# Patient Record
Sex: Male | Born: 1975 | Race: White | Hispanic: No | Marital: Married | Smoking: Current every day smoker
Health system: Southern US, Community
[De-identification: ages and names within clinical notes are randomized; demographics above are authoritative.]

## PROBLEM LIST (undated history)

## (undated) DIAGNOSIS — R0602 Shortness of breath: Secondary | ICD-10-CM

## (undated) DIAGNOSIS — A048 Other specified bacterial intestinal infections: Secondary | ICD-10-CM

## (undated) DIAGNOSIS — M545 Low back pain, unspecified: Secondary | ICD-10-CM

## (undated) DIAGNOSIS — F419 Anxiety disorder, unspecified: Secondary | ICD-10-CM

## (undated) HISTORY — DX: Low back pain: M54.5

## (undated) HISTORY — PX: FRACTURE SURGERY: SHX138

## (undated) HISTORY — PX: KNEE ARTHROSCOPY: SUR90

## (undated) HISTORY — DX: Low back pain, unspecified: M54.50

---

## 2001-05-13 ENCOUNTER — Emergency Department (HOSPITAL_COMMUNITY): Admission: EM | Admit: 2001-05-13 | Discharge: 2001-05-14 | Payer: Self-pay | Admitting: Emergency Medicine

## 2001-05-13 ENCOUNTER — Encounter: Payer: Self-pay | Admitting: Emergency Medicine

## 2007-07-09 ENCOUNTER — Other Ambulatory Visit: Admission: RE | Admit: 2007-07-09 | Discharge: 2007-07-09 | Payer: Self-pay | Admitting: Family Medicine

## 2007-07-09 ENCOUNTER — Encounter (INDEPENDENT_AMBULATORY_CARE_PROVIDER_SITE_OTHER): Payer: Self-pay | Admitting: Family Medicine

## 2010-11-04 ENCOUNTER — Encounter: Payer: Self-pay | Admitting: Family Medicine

## 2011-11-16 DIAGNOSIS — A048 Other specified bacterial intestinal infections: Secondary | ICD-10-CM

## 2011-11-16 HISTORY — DX: Other specified bacterial intestinal infections: A04.8

## 2011-12-04 ENCOUNTER — Encounter (HOSPITAL_COMMUNITY): Payer: Self-pay | Admitting: *Deleted

## 2011-12-04 ENCOUNTER — Emergency Department (HOSPITAL_COMMUNITY)
Admission: EM | Admit: 2011-12-04 | Discharge: 2011-12-04 | Disposition: A | Discharge status: Home / Self Care | Payer: BC Managed Care – PPO | Attending: Emergency Medicine | Admitting: Emergency Medicine

## 2011-12-04 DIAGNOSIS — B019 Varicella without complication: Secondary | ICD-10-CM

## 2011-12-04 DIAGNOSIS — R109 Unspecified abdominal pain: Secondary | ICD-10-CM | POA: Insufficient documentation

## 2011-12-04 DIAGNOSIS — F17200 Nicotine dependence, unspecified, uncomplicated: Secondary | ICD-10-CM | POA: Insufficient documentation

## 2011-12-04 DIAGNOSIS — IMO0001 Reserved for inherently not codable concepts without codable children: Secondary | ICD-10-CM | POA: Insufficient documentation

## 2011-12-04 HISTORY — DX: Other specified bacterial intestinal infections: A04.8

## 2011-12-04 LAB — DIFFERENTIAL
Basophils Absolute: 0.1 10*3/uL (ref 0.0–0.1)
Basophils Relative: 1 % (ref 0–1)
Eosinophils Absolute: 0.3 10*3/uL (ref 0.0–0.7)
Eosinophils Relative: 3 % (ref 0–5)
Lymphocytes Relative: 35 % (ref 12–46)

## 2011-12-04 LAB — CBC
MCV: 88.2 fL (ref 78.0–100.0)
Platelets: 284 10*3/uL (ref 150–400)
RDW: 13.4 % (ref 11.5–15.5)
WBC: 10.8 10*3/uL — ABNORMAL HIGH (ref 4.0–10.5)

## 2011-12-04 LAB — COMPREHENSIVE METABOLIC PANEL
Albumin: 4 g/dL (ref 3.5–5.2)
Alkaline Phosphatase: 82 U/L (ref 39–117)
BUN: 11 mg/dL (ref 6–23)
Chloride: 102 mEq/L (ref 96–112)
Glucose, Bld: 83 mg/dL (ref 70–99)
Potassium: 4.8 mEq/L (ref 3.5–5.1)
Total Bilirubin: 0.2 mg/dL — ABNORMAL LOW (ref 0.3–1.2)

## 2011-12-04 MED ORDER — VALACYCLOVIR HCL 1 G PO TABS: 500.0000 mg | ORAL_TABLET | Freq: Three times a day (TID) | ORAL | Status: AC

## 2011-12-04 NOTE — ED Notes (Signed)
Recent dx of H pylori,  Now has a rash and cont to have abd pain and  Body aches.

## 2011-12-04 NOTE — ED Provider Notes (Signed)
History   This chart was scribed for Nelia Shi, MD by Sofie Rower. The patient was seen in room APA06/APA06 and the patient's care was started at 2:16PM.    CSN: [id number]  Arrival date & time 12/04/11  1258   First MD Initiated Contact with Patient 12/04/11 1414      Chief Complaint  Patient presents with  . Abdominal Pain    (Consider location/radiation/quality/duration/timing/severity/associated sxs/prior treatment) HPI  Keith Willis is a 36 y.o. male who presents to the Emergency Department complaining of moderate, constant abdominal pain onset 7 days ago with associated symptoms of myalgias, vomiting, rash. Pt has a hx of H pylori. Pt works as a Software engineer. Pt states he has recently "seen some lab exposure."   PCP is Dr. Regino Schultze.  Past Medical History  Diagnosis Date  . H. pylori infection     Past Surgical History  Procedure Date  . Knee arthroscopy   . Fracture surgery     History reviewed. No pertinent family history.  History  Substance Use Topics  . Smoking status: Current Everyday Smoker  . Smokeless tobacco: Not on file  . Alcohol Use: No      Review of Systems  All other systems reviewed and are negative.    Allergies  Review of patient's allergies indicates no known allergies.  Home Medications   Current Outpatient Rx  Name Route Sig Dispense Refill  . AMOXICILLIN 500 MG PO CAPS Oral Take 1,000 mg by mouth 2 (two) times daily.    Marland Kitchen CLARITHROMYCIN ER 500 MG PO TB24 Oral Take 500 mg by mouth daily.    . CYCLOBENZAPRINE HCL 10 MG PO TABS Oral Take 10 mg by mouth 3 (three) times daily as needed. For muscle cramps    . OMEPRAZOLE 20 MG PO CPDR Oral Take 20 mg by mouth daily.    . OXYCODONE-ACETAMINOPHEN 10-325 MG PO TABS Oral Take 1 tablet by mouth every 6 (six) hours as needed. For pain    . VALACYCLOVIR HCL 1 G PO TABS Oral Take 0.5 tablets (500 mg total) by mouth 3 (three) times daily. 15 tablet 0     BP 136/83  Pulse 66  Temp(Src) 97.9 F (36.6 C) (Oral)  Resp 20  Ht 5\' 7"  (1.702 m)  Wt 160 lb (72.576 kg)  BMI 25.06 kg/m2  SpO2 98%  Physical Exam  Nursing note and vitals reviewed. Constitutional: He is oriented to person, place, and time. He appears well-developed and well-nourished. No distress.  HENT:  Head: Normocephalic and atraumatic.  Nose: Nose normal.  Eyes: EOM are normal. Pupils are equal, round, and reactive to light.  Neck: Normal range of motion. Neck supple.  Cardiovascular: Normal rate and intact distal pulses.   Pulmonary/Chest: No respiratory distress.  Abdominal: Normal appearance. He exhibits no distension.  Musculoskeletal: Normal range of motion.  Neurological: He is alert and oriented to person, place, and time. No cranial nerve deficit.  Skin: Skin is warm and dry. Rash noted.  Psychiatric: He has a normal mood and affect. His behavior is normal.    ED Course  Procedures (including critical care time)  DIAGNOSTIC STUDIES: Oxygen Saturation is 98% on room air, normal by my interpretation.    COORDINATION OF CARE:      Labs Reviewed  COMPREHENSIVE METABOLIC PANEL - Abnormal; Notable for the following:    Total Bilirubin 0.2 (*)    All other components within normal limits  CBC - Abnormal; Notable for the following:    WBC 10.8 (*)    All other components within normal limits  DIFFERENTIAL   No results found.   1. Chicken pox     2:20PM- EDP at bedside discusses treatment plan.  MDM    1. Chicken pox         I personally performed the services described in this documentation, which was scribed in my presence. The recorded information has been reviewed and considered.     Nelia Shi, MD 12/04/11 1535

## 2011-12-04 NOTE — ED Notes (Signed)
Dr Beaton at bedside 

## 2011-12-04 NOTE — Discharge Instructions (Signed)
Chickenpox (Varicella) Chickenpox (Varicella) is a viral infection that is more common in children. It tends to be a mild illness for most healthy children. It can be more severe in:  Adults.   Newborns.   People with immune system problems.   People receiving cancer treatment.  CAUSES  Chickenpox is caused by a virus called Varicella-Zoster Virus (VZV). VZV causes both chickenpox and shingles. To get chickenpox, a susceptible person (able to catch an infection) must be exposed to either someone with chickenpox or shingles. A person is susceptible if:  They have not had the infection before.   They were not immunized against VZV.   An immunization did not give complete protection against VZV (breakthrough chickenpox).  Chickenpox is very contagious. It is contagious from 1 to 2 days before the rash appears. It is also contagious until the blisters are crusted. The blisters usually become crusted 3 to 7 days after the rash begins. It usually takes about 2 weeks before symptoms show up. SYMPTOMS  Typical chickenpox symptoms include:  Fever.   Headache.   Poor appetite.   An itchy rash that changes over time:   It starts as red spots that become bumps.   Bumps become blisters.   Blisters turn into scabs.  Breakthrough chickenpox happens when an immunized person still gets chickenpox. The symptoms are less severe. The rash may only be red spots or bumps, with no blisters or scabs. Fever may be low or absent. DIAGNOSIS  Typical chickenpox is diagnosed by physical exam. A blood test can confirm the diagnosis, when the disease is not certain. TREATMENT  Most of the time, in healthy children, only home treatments are needed. In some cases, in the early stages of chickenpox, your caregiver may prescribe antiviral medicines. These medicines may decrease the severity of the illness and prevent complications. In the rare complicated case, treatment in the hospital is needed. Intravenous  (IV) medicine and other treatments can be given in the hospital. Your caregiver may prescribe medicine to relieve itching. To prevent the spread of chickenpox to at risk people, your caregiver may prescribe:  Immunization.   Antiviral medicine.   Immune globulin.  HOME CARE INSTRUCTIONS   For fever:   Do not give aspirin to children. This could lead to brain and liver damage through Reye's syndrome. Read the label on over-the-counter medicines used.   Only take over-the-counter or prescription medicines for pain, discomfort or fever as directed by your caregiver.   For itching:   If your caregiver prescribed medicine, take as directed.   You may use plain calamine lotion on the itching sores. Follow the directions on the label. Do not use on sores in the mouth.   Avoid scratching the rash or picking off the scabs. Keep fingernails cut short and clean. Put cotton gloves or socks on your child's hands at night.   Keep a child with chickenpox quiet and cool. Sweating and overheating makes itching worse. Stay out of the sun.   Cool compresses may be applied to itchy areas.   Cool water baths with baking soda or oatmeal soap may help.   For painful sores in the mouth; pain medicine and cold foods, like frozen pops, may feel good.   Drink plenty of fluids. Avoid salty or acidic liquids (tomato or orange juice). These irritate mouth sores and cause pain.   People with chickenpox should avoid exposure (being in the same room) with:   Pregnant women (especially if they have not  had chickenpox or been immunized against it).   Young infants.   People receiving cancer treatments or long-term steroids.   People with immune system problems.   The elderly.   Any child or adult with chickenpox should stay home until all blisters have crusted. If there are no blisters, the child or adult should stay home until no new spots show up.  SEEK MEDICAL CARE IF:   You or your child has an  oral temperature above 102 F (38.9 C).   Your baby is older than 3 months with a rectal temperature of 100.5 F (38.1 C) or higher for more than 1 day.   The sores are infected. Look for:   Swelling.   Increasing redness.   Red streaks.   Tenderness.   Yellow or green pus coming from blisters.   Cough.   New symptoms develop that concern you.  SEEK IMMEDIATE MEDICAL CARE IF:  You or your child develops:  Vomiting.   Confusion, unusual sleepiness or odd behavior.   Neck stiffness.   Seizures (convulsions).   Loss of balance.   Chest pain.   Trouble breathing or fast breathing.   Blood in urine.   Rectal bleeding.   Bruising of the skin or bleeding in the blisters.   Blisters in the eye.   Eye pain, redness or decreased vision.   You or your child has an oral temperature above 102 F (38.9 C), not controlled by medicine.   Your baby is older than 3 months with a rectal temperature of 102 F (38.9 C) or higher.   Your baby is 60 months old or younger with a rectal temperature of 100.4 F (38 C) or higher.  MAKE SURE YOU:   Understand these instructions.   Will watch your condition.   Will get help right away if you are not doing well or get worse.  Document Released: 09/28/2000 Document Revised: 06/13/2011 Document Reviewed: 04/15/2008 [facility] Patient Information 2012 [place], [place].

## 2011-12-11 ENCOUNTER — Encounter: Payer: Self-pay | Admitting: Urgent Care

## 2011-12-11 ENCOUNTER — Ambulatory Visit (INDEPENDENT_AMBULATORY_CARE_PROVIDER_SITE_OTHER): Payer: BC Managed Care – PPO | Admitting: Urgent Care

## 2011-12-11 DIAGNOSIS — K921 Melena: Secondary | ICD-10-CM

## 2011-12-11 DIAGNOSIS — A048 Other specified bacterial intestinal infections: Secondary | ICD-10-CM

## 2011-12-11 DIAGNOSIS — R109 Unspecified abdominal pain: Secondary | ICD-10-CM | POA: Insufficient documentation

## 2011-12-11 NOTE — Assessment & Plan Note (Signed)
Chronic, intermittent. Differentials include benign anorectal source such as hemorrhoids, inflammatory bowel disease, less likely ischemia or colorectal neoplasia.  Stop using Advil/ Motrin or anti-inflammatories for now Colonoscopy and EGD with Dr. Jena Gauss in the near future.  I have discussed risks & benefits which include, but are not limited to, bleeding, infection, perforation & drug reaction.  The patient agrees with this plan & written consent will be obtained.

## 2011-12-11 NOTE — Progress Notes (Signed)
Faxed to PCP

## 2011-12-11 NOTE — Progress Notes (Signed)
Referring Provider: McGough, William M, MD Primary Care Physician:  MCGOUGH,WILLIAM M, MD, MD Primary Gastroenterologist:  Dr. Rourk  Chief Complaint  Patient presents with  . Abdominal Pain    h pylori    HPI:  Keith Willis is a 35 y.o. male here as a referral from Dr. McGough for chronic abdominal pain and H. Pylori.  He gives lifelong history of intermittent chronic abdominal pain. He describes memories of weight lifting in high school that cause some abdominal pain followed by bleeding hemorrhoids. He tells me has always had a "sensitive stomach".  Somewhere around Age 19 he contracted a virus.   For several weeks after that he could not keep anything down due to persistent vomiting.  He has intermittent abdominal pain, followed by diaphoresis.  He describes the pain as "gas-like".   He was diagnosed with h pylori and has almost completed treatment with Biaxin, Amoxil, and Prilosec.  He has noticed no difference since H. pylori treatment began. He gives history of taking TUMS religiously.  He rarely complains of acid reflux.  Abdominal pain usually begins around the mid abd and radiates to his chest at times.  He has nausea , but has not vomited since 2 wks ago.  Pain 10/10 at worst, usually lasts 30 secs to 30 min.  otherwise it is a continuous dull aching pain after the severe pain subsides.  There is no particular time of day for his pain.  He has been eating large meals due to work schedule and frequent periods of fasting.  He admits to eating a lot of greasy food.  However, his pain is not assoc w/ eating or movement.  Relieved fetal position makes it better.  He gives history of greater than 25 pound weight loss from 2008-2009, but has since been stable.  C/o anorexia.  Feels food lodges in throat, and he must flush w/ bolus of liquid.  Denies odynophagia.  BM irregular BID to 1-2 per week.  He has been noticing small to moderate amount of bright red blood w/ wiping on the toilet paper and he  thinks he has hemorrhoids.  Takes an occasional IBU prn & percocet prn for chronic low back pain.  Recent labs reviewed from December 2012 and February 11th 2013: He is normal CMP and CBC currently H. pylori antigen 2.92  Past Medical History  Diagnosis Date  . H. pylori infection 11/2011    treatment  . Low back pain     Past Surgical History  Procedure Date  . Knee arthroscopy   . Fracture surgery     face    Current Outpatient Prescriptions  Medication Sig Dispense Refill  . amoxicillin (AMOXIL) 500 MG capsule Take 1,000 mg by mouth 2 (two) times daily.      . clarithromycin (BIAXIN XL) 500 MG 24 hr tablet Take 500 mg by mouth daily.      . cyclobenzaprine (FLEXERIL) 10 MG tablet Take 10 mg by mouth 3 (three) times daily as needed. For muscle cramps      . ibuprofen (ADVIL,MOTRIN) 200 MG tablet Take 200 mg by mouth every 6 (six) hours as needed.      . omeprazole (PRILOSEC) 20 MG capsule Take 20 mg by mouth daily.      . oxyCODONE-acetaminophen (PERCOCET) 10-325 MG per tablet Take 1 tablet by mouth every 6 (six) hours as needed. For pain      . valACYclovir (VALTREX) 1000 MG tablet Take 0.5 tablets (500 mg   total) by mouth 3 (three) times daily.  15 tablet  0    Allergies as of 12/11/2011  . (No Known Allergies)    Family History:There is no known family history of colorectal carcinoma , liver disease, or inflammatory bowel disease.  Problem Relation Age of Onset  . Ulcers Father     History   Social History  . Marital Status: Married    Spouse Name: N/A    Number of Children: 2  . Years of Education: N/A   Occupational History  . 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 Smoking status: Current Everyday Smoker -- 0.5 packs/day for 8 years    Types: Cigarettes  . Smokeless tobacco: Not on file   Comment: stress  . Alcohol Use: No  . Drug Use: No  . Sexually Active: Not on file   Other Topics Concern  . Not on file   Social History Narrative   2  healthy daughters  Review of Systems: Gen: Denies any fever, chills, sweats, anorexia, fatigue, weakness, malaise, and sleep disorder CV: Denies angina, palpitations, syncope, orthopnea, PND, peripheral edema, and claudication. Resp: Denies dyspnea at rest, dyspnea with exercise, cough, sputum, wheezing, coughing up blood, and pleurisy. GI: Denies vomiting blood, jaundice, and fecal incontinence.  GU : Denies urinary burning, blood in urine, urinary frequency, urinary hesitancy, nocturnal urination, and urinary incontinence. MS: Complains of chronic low back pain Denies joint pain, limitation of movement, and swelling, stiffness,extremity pain. Denies muscle weakness, cramps, atrophy.  Derm:  Recent herpes zoster Psych: Denies depression, anxiety, memory loss, suicidal ideation, hallucinations, paranoia, and confusion. Heme: Denies bruising, bleeding, and enlarged lymph nodes. Neuro:  Denies any headaches, dizziness.  Intermittent Numbness and tingling down left leg Endo:  Denies any problems with DM, thyroid, adrenal function.  Physical Exam: BP 127/85  Pulse 91  Temp(Src) 98.2 F (36.8 C) (Temporal)  Ht 5' 7.5" (1.715 m)  Wt 166 lb 3.2 oz (75.388 kg)  BMI 25.65 kg/m2 General:   Alert,  Well-developed, well-nourished, pleasant and cooperative in NAD Head:  Normocephalic and atraumatic. Eyes:  Sclera clear, no icterus.   Conjunctiva pink. Ears:  Normal auditory acuity. Nose:  No deformity, discharge, or lesions. Mouth:  No deformity or lesions,oropharynx pink & moist. Neck:  Supple; no masses or thyromegaly. Lungs:  Clear throughout to auscultation.   No wheezes, crackles, or rhonchi. No acute distress. Heart:  Regular rate and rhythm; no murmurs, clicks, rubs,  or gallops. Abdomen:  Normal bowel sounds.  No bruits.  Soft, non-tender and non-distended without masses, hepatosplenomegaly or hernias noted.  No guarding or rebound tenderness.   Rectal:  Deferred until colonoscopy. Msk:   Symmetrical without gross deformities. Normal posture. Pulses:  Normal pulses noted. Extremities:  No clubbing or edema. Neurologic:  Alert and oriented x4;  grossly normal neurologically. Skin: Multiple punctate erythematous scabs to anterior chest. No exudates. No vesicles. Lymph Nodes:  No significant cervical adenopathy. Psych:  Alert and cooperative. Normal mood and affect.  

## 2011-12-11 NOTE — Assessment & Plan Note (Signed)
Complete treatment

## 2011-12-11 NOTE — Patient Instructions (Signed)
Increase Prilosec to 20 mg before breakfast and dinner Complete Biaxin and Amoxil as directed Stop using Advil Motrin or anti-inflammatories for now You will need a colonoscopy and EGD with Dr. Jena Gauss in the near future 1-800-quit-now for help with quitting smoking To ER if severe abdominal pain

## 2011-12-11 NOTE — Assessment & Plan Note (Addendum)
Keith Willis is a pleasant 36 y.o. male with chronic abdominal pain. He also has intermittent bouts of nausea and vomiting. He has recently been treated for H. Pylori.  Etiology of his abdominal pain is unclear at this time.  He is going to need EGD and colonoscopy for further evaluation. Differentials include peptic ulcer disease, H. pylori gastritis, inflammatory bowel disease, or functional abdominal pain.  1-800-quit-now for help with quitting smoking To ER if severe abdominal pain Continue Prilosec 20 mg twice a day

## 2011-12-12 ENCOUNTER — Other Ambulatory Visit: Payer: Self-pay | Admitting: Gastroenterology

## 2011-12-12 DIAGNOSIS — K921 Melena: Secondary | ICD-10-CM

## 2011-12-12 DIAGNOSIS — R109 Unspecified abdominal pain: Secondary | ICD-10-CM

## 2011-12-12 MED ORDER — PEG-KCL-NACL-NASULF-NA ASC-C 100 G PO SOLR: 1.0000 | Freq: Once | ORAL | Status: DC

## 2011-12-17 ENCOUNTER — Encounter (HOSPITAL_COMMUNITY): Payer: Self-pay | Admitting: Pharmacy Technician

## 2011-12-19 ENCOUNTER — Encounter: Payer: Self-pay | Admitting: Gastroenterology

## 2011-12-19 MED ORDER — SODIUM CHLORIDE 0.45 % IV SOLN
Freq: Once | INTRAVENOUS | Status: AC
  Administered 2011-12-20: 10:00:00 via INTRAVENOUS

## 2011-12-20 ENCOUNTER — Encounter (HOSPITAL_COMMUNITY)
Admission: RE | Disposition: A | Discharge status: Home / Self Care | Payer: Self-pay | Source: Ambulatory Visit | Attending: Internal Medicine

## 2011-12-20 ENCOUNTER — Encounter (HOSPITAL_COMMUNITY): Payer: Self-pay | Admitting: *Deleted

## 2011-12-20 ENCOUNTER — Ambulatory Visit (HOSPITAL_COMMUNITY)
Admission: RE | Admit: 2011-12-20 | Discharge: 2011-12-20 | Disposition: A | Discharge status: Home / Self Care | Payer: BC Managed Care – PPO | Source: Ambulatory Visit | Attending: Internal Medicine | Admitting: Internal Medicine

## 2011-12-20 DIAGNOSIS — D126 Benign neoplasm of colon, unspecified: Secondary | ICD-10-CM

## 2011-12-20 DIAGNOSIS — R1013 Epigastric pain: Secondary | ICD-10-CM

## 2011-12-20 DIAGNOSIS — K2940 Chronic atrophic gastritis without bleeding: Secondary | ICD-10-CM | POA: Insufficient documentation

## 2011-12-20 DIAGNOSIS — K648 Other hemorrhoids: Secondary | ICD-10-CM | POA: Insufficient documentation

## 2011-12-20 DIAGNOSIS — K921 Melena: Secondary | ICD-10-CM | POA: Insufficient documentation

## 2011-12-20 DIAGNOSIS — K2289 Other specified disease of esophagus: Secondary | ICD-10-CM

## 2011-12-20 DIAGNOSIS — K228 Other specified diseases of esophagus: Secondary | ICD-10-CM

## 2011-12-20 DIAGNOSIS — R109 Unspecified abdominal pain: Secondary | ICD-10-CM | POA: Insufficient documentation

## 2011-12-20 DIAGNOSIS — K625 Hemorrhage of anus and rectum: Secondary | ICD-10-CM

## 2011-12-20 HISTORY — DX: Shortness of breath: R06.02

## 2011-12-20 HISTORY — DX: Anxiety disorder, unspecified: F41.9

## 2011-12-20 SURGERY — COLONOSCOPY WITH ESOPHAGOGASTRODUODENOSCOPY (EGD)
Anesthesia: Moderate Sedation

## 2011-12-20 MED ORDER — MEPERIDINE HCL 100 MG/ML IJ SOLN
INTRAMUSCULAR | Status: AC
  Filled 2011-12-20: qty 1

## 2011-12-20 MED ORDER — BUTAMBEN-TETRACAINE-BENZOCAINE 2-2-14 % EX AERO
INHALATION_SPRAY | CUTANEOUS | Status: DC | PRN
  Administered 2011-12-20: 1 via TOPICAL

## 2011-12-20 MED ORDER — STERILE WATER FOR IRRIGATION IR SOLN
Status: DC | PRN
  Administered 2011-12-20: 10:00:00

## 2011-12-20 MED ORDER — MIDAZOLAM HCL 5 MG/5ML IJ SOLN
INTRAMUSCULAR | Status: DC | PRN
  Administered 2011-12-20: 1 mg via INTRAVENOUS
  Administered 2011-12-20 (×2): 2 mg via INTRAVENOUS
  Administered 2011-12-20: 1 mg via INTRAVENOUS
  Administered 2011-12-20: 2 mg via INTRAVENOUS
  Administered 2011-12-20: 1 mg via INTRAVENOUS

## 2011-12-20 MED ORDER — MEPERIDINE HCL 100 MG/ML IJ SOLN
INTRAMUSCULAR | Status: DC | PRN
  Administered 2011-12-20 (×2): 50 mg via INTRAVENOUS
  Administered 2011-12-20: 25 mg via INTRAVENOUS
  Administered 2011-12-20: 50 mg via INTRAVENOUS
  Administered 2011-12-20 (×2): 25 mg via INTRAVENOUS

## 2011-12-20 MED ORDER — MIDAZOLAM HCL 5 MG/5ML IJ SOLN
INTRAMUSCULAR | Status: AC
  Filled 2011-12-20: qty 10

## 2011-12-20 MED ORDER — MEPERIDINE HCL 100 MG/ML IJ SOLN
INTRAMUSCULAR | Status: AC
  Filled 2011-12-20: qty 2

## 2011-12-20 NOTE — Op Note (Signed)
[facility] [address] [place], [place]  [address]  COLONOSCOPY PROCEDURE REPORT  PATIENT:  Keith Willis, Keith Willis  MR#:  [id number] BIRTHDATE:  12-22-1975, 35 yrs. old  GENDER:  male ENDOSCOPIST:  R. Roetta Sessions, MD FACP [facility] REF. BY:  Karleen Hampshire, M.D. PROCEDURE DATE:  12/20/2011 PROCEDURE:  Ileo- colonoscopy with biopsy  INDICATIONS:  Rectal bleeding  INFORMED CONSENT:  The risks, benefits, alternatives and imponderables including but not limited to bleeding, perforation as well as the possibility of a missed lesion have been reviewed. The potential for biopsy, lesion removal, etc. have also been discussed.  Questions have been answered.  All parties agreeable. Please see the history and physical in the medical record for more information.  MEDICATIONS:  Demerol 225 mg IV and Versed 9 mg IV in divided dose  DESCRIPTION OF PROCEDURE:  After a digital rectal exam was performed, the EC-[id number] ([id number]) colonoscope was advanced from the anus through the rectum and colon to the area of the cecum, ileocecal valve and appendiceal orifice.  The cecum was deeply intubated.  These structures were well-seen and photographed for the record.  From the level of the cecum and ileocecal valve, the scope was slowly and cautiously withdrawn.  The mucosal surfaces were carefully surveyed utilizing scope tip deflection to facilitate fold flattening as needed.  The scope was pulled down into the rectum where a thorough examination including retroflexion was performed. <<PROCEDUREIMAGES>>  FINDINGS: Adequate preparation. Minimal anal canal hemorrhoids; otherwise normal rectum. A focal 5 mm area of abrasion overlying polypoid mucosa in the mid descending segment. There was a 4 mm diminutive polyp in the mid descending colon; otherwise, the colonic mucosa appeared normal. The distal 5 cm of terminal ileal mucosa also appeared normal.  THERAPEUTIC / DIAGNOSTIC MANEUVERS PERFORMED:   The 2 abnormal areas above were cold biopsied  COMPLICATIONS:  None  CECAL WITHDRAWAL TIME: 10 minutes  IMPRESSION:  Colonic polyps-treated as described above. Minimal anal canal hemorrhoids.  RECOMMENDATIONS:  Follow up on pathology  ______________________________ R. Roetta Sessions, MD Caleen Essex  CC:  Karleen Hampshire, M.D.  n. eSIGNED:   R. Roetta Sessions at 12/20/2011 11:21 AM  [place], [phone]

## 2011-12-20 NOTE — Interval H&P Note (Signed)
History and Physical Interval Note:  12/20/2011 10:08 AM  Keith Willis  has presented today for surgery, with the diagnosis of abd pain and hematochezia  The various methods of treatment have been discussed with the patient and family. After consideration of risks, benefits and other options for treatment, the patient has consented to  Procedure(s) (LRB): COLONOSCOPY WITH ESOPHAGOGASTRODUODENOSCOPY (EGD) (N/A) as a surgical intervention .  The patients' history has been reviewed, patient examined, no change in status, stable for surgery.  I have reviewed the patients' chart and labs.  Questions were answered to the patient's satisfaction.     Eula Listen

## 2011-12-20 NOTE — Discharge Instructions (Signed)
Colonoscopy Discharge Instructions  Read the instructions outlined below and refer to this sheet in the next few weeks. These discharge instructions provide you with general information on caring for yourself after you leave the hospital. Your doctor may also give you specific instructions. While your treatment has been planned according to the most current medical practices available, unavoidable complications occasionally occur. If you have any problems or questions after discharge, call Dr. Gala Romney at [phone]. ACTIVITY  You may resume your regular activity, but move at a slower pace for the next 24 hours.   Take frequent rest periods for the next 24 hours.   Walking will help get rid of the air and reduce the bloated feeling in your belly (abdomen).   No driving for 24 hours (because of the medicine (anesthesia) used during the test).    Do not sign any important legal documents or operate any machinery for 24 hours (because of the anesthesia used during the test).  NUTRITION  Drink plenty of fluids.   You may resume your normal diet as instructed by your doctor.   Begin with a light meal and progress to your normal diet. Heavy or fried foods are harder to digest and may make you feel sick to your stomach (nauseated).   Avoid alcoholic beverages for 24 hours or as instructed.  MEDICATIONS  You may resume your normal medications unless your doctor tells you otherwise.  WHAT YOU CAN EXPECT TODAY  Some feelings of bloating in the abdomen.   Passage of more gas than usual.   Spotting of blood in your stool or on the toilet paper.  IF YOU HAD POLYPS REMOVED DURING THE COLONOSCOPY:  No aspirin products for 7 days or as instructed.   No alcohol for 7 days or as instructed.   Eat a soft diet for the next 24 hours.  FINDING OUT THE RESULTS OF YOUR TEST Not all test results are available during your visit. If your test results are not back during the visit, make an appointment  with your caregiver to find out the results. Do not assume everything is normal if you have not heard from your caregiver or the medical facility. It is important for you to follow up on all of your test results.  SEEK IMMEDIATE MEDICAL ATTENTION IF:  You have more than a spotting of blood in your stool.   Your belly is swollen (abdominal distention).   You are nauseated or vomiting.   You have a temperature over 101.   You have abdominal pain or discomfort that is severe or gets worse throughout the day.   EGD Discharge instructions Please read the instructions outlined below and refer to this sheet in the next few weeks. These discharge instructions provide you with general information on caring for yourself after you leave the hospital. Your doctor may also give you specific instructions. While your treatment has been planned according to the most current medical practices available, unavoidable complications occasionally occur. If you have any problems or questions after discharge, please call your doctor. ACTIVITY  You may resume your regular activity but move at a slower pace for the next 24 hours.   Take frequent rest periods for the next 24 hours.   Walking will help expel (get rid of) the air and reduce the bloated feeling in your abdomen.   No driving for 24 hours (because of the anesthesia (medicine) used during the test).   You may shower.   Do not sign any  important legal documents or operate any machinery for 24 hours (because of the anesthesia used during the test).  NUTRITION  Drink plenty of fluids.   You may resume your normal diet.   Begin with a light meal and progress to your normal diet.   Avoid alcoholic beverages for 24 hours or as instructed by your caregiver.  MEDICATIONS  You may resume your normal medications unless your caregiver tells you otherwise.  WHAT YOU CAN EXPECT TODAY  You may experience abdominal discomfort such as a feeling of  fullness or "gas" pains.  FOLLOW-UP  Your doctor will discuss the results of your test with you.  SEEK IMMEDIATE MEDICAL ATTENTION IF ANY OF THE FOLLOWING OCCUR:  Excessive nausea (feeling sick to your stomach) and/or vomiting.   Severe abdominal pain and distention (swelling).   Trouble swallowing.   Temperature over 101 F (37.8 C).   Rectal bleeding or vomiting of blood.    Start Prilosec; begin AcipHex 20 mg orally daily. Use a free coupon.  Further recommendations to follow pending review of pathology report.  Hemorrhoid literature provided. Hemorrhoids Hemorrhoids are enlarged (dilated) veins around the rectum. There are 2 types of hemorrhoids, and the type of hemorrhoid is determined by its location. Internal hemorrhoids occur in the veins just inside the rectum.They are usually not painful, but they may bleed.However, they may poke through to the outside and become irritated and painful. External hemorrhoids involve the veins outside the anus and can be felt as a painful swelling or hard lump near the anus.They are often itchy and may crack and bleed. Sometimes clots will form in the veins. This makes them swollen and painful. These are called thrombosed hemorrhoids. CAUSES Causes of hemorrhoids include: Pregnancy. This increases the pressure in the hemorrhoidal veins.  Constipation.  Straining to have a bowel movement.  Obesity.  Heavy lifting or other activity that caused you to strain.  TREATMENT Most of the time hemorrhoids improve in 1 to 2 weeks. However, if symptoms do not seem to be getting better or if you have a lot of rectal bleeding, your caregiver may perform a procedure to help make the hemorrhoids get smaller or remove them completely.Possible treatments include: Rubber band ligation. A rubber band is placed at the base of the hemorrhoid to cut off the circulation.  Sclerotherapy. A chemical is injected to shrink the hemorrhoid.  Infrared light therapy.  Tools are used to burn the hemorrhoid.  Hemorrhoidectomy. This is surgical removal of the hemorrhoid.  HOME CARE INSTRUCTIONS  Increase fiber in your diet. Ask your caregiver about using fiber supplements.  Drink enough water and fluids to keep your urine clear or pale yellow.  Exercise regularly.  Go to the bathroom when you have the urge to have a bowel movement. Do not wait.  Avoid straining to have bowel movements.  Keep the anal area dry and clean.  Only take over-the-counter or prescription medicines for pain, discomfort, or fever as directed by your caregiver.  If your hemorrhoids are thrombosed: Take warm sitz baths for 20 to 30 minutes, 3 to 4 times per day.  If the hemorrhoids are very tender and swollen, place ice packs on the area as tolerated. Using ice packs between sitz baths may be helpful. Fill a plastic bag with ice. Place a towel between the bag of ice and your skin.  Medicated creams and suppositories may be used or applied as directed.  Do not use a donut-shaped pillow or sit on the  toilet for long periods. This increases blood pooling and pain.  SEEK MEDICAL CARE IF:  You have increasing pain and swelling that is not controlled with your medicine.  You have uncontrolled bleeding.  You have difficulty or you are unable to have a bowel movement.  You have pain or inflammation outside the area of the hemorrhoids.  You have chills or an oral temperature above 102 F (38.9 C).  MAKE SURE YOU:  Understand these instructions.  Will watch your condition.  Will get help right away if you are not doing well or get worse.  Document Released: 09/28/2000 Document Revised: 09/20/2011 Document Reviewed: 02/03/2008 [facility] Patient Information 2012 [place], [place].

## 2011-12-20 NOTE — H&P (View-Only) (Signed)
Referring Provider: Kirk Ruths, MD Primary Care Physician:  Kirk Ruths, MD, MD Primary Gastroenterologist:  Dr. Jena Gauss  Chief Complaint  Patient presents with  . Abdominal Pain    h pylori    HPI:  Keith Willis is a 36 y.o. male here as a referral from Dr. Regino Schultze for chronic abdominal pain and H. Pylori.  He gives lifelong history of intermittent chronic abdominal pain. He describes memories of weight lifting in high school that cause some abdominal pain followed by bleeding hemorrhoids. He tells me has always had a "sensitive stomach".  Somewhere around Age 78 he contracted a virus.   For several weeks after that he could not keep anything down due to persistent vomiting.  He has intermittent abdominal pain, followed by diaphoresis.  He describes the pain as "gas-like".   He was diagnosed with h pylori and has almost completed treatment with Biaxin, Amoxil, and Prilosec.  He has noticed no difference since H. pylori treatment began. He gives history of taking TUMS religiously.  He rarely complains of acid reflux.  Abdominal pain usually begins around the mid abd and radiates to his chest at times.  He has nausea , but has not vomited since 2 wks ago.  Pain 10/10 at worst, usually lasts 30 secs to 30 min.  otherwise it is a continuous dull aching pain after the severe pain subsides.  There is no particular time of day for his pain.  He has been eating large meals due to work schedule and frequent periods of fasting.  He admits to eating a lot of greasy food.  However, his pain is not assoc w/ eating or movement.  Relieved fetal position makes it better.  He gives history of greater than 25 pound weight loss from 2008-2009, but has since been stable.  C/o anorexia.  Feels food lodges in throat, and he must flush w/ bolus of liquid.  Denies odynophagia.  BM irregular BID to 1-2 per week.  He has been noticing small to moderate amount of bright red blood w/ wiping on the toilet paper and he  thinks he has hemorrhoids.  Takes an occasional IBU prn & percocet prn for chronic low back pain.  Recent labs reviewed from December 2012 and February 11th 2013: He is normal CMP and CBC currently H. pylori antigen 2.92  Past Medical History  Diagnosis Date  . H. pylori infection 11/2011    treatment  . Low back pain     Past Surgical History  Procedure Date  . Knee arthroscopy   . Fracture surgery     face    Current Outpatient Prescriptions  Medication Sig Dispense Refill  . amoxicillin (AMOXIL) 500 MG capsule Take 1,000 mg by mouth 2 (two) times daily.      . clarithromycin (BIAXIN XL) 500 MG 24 hr tablet Take 500 mg by mouth daily.      . cyclobenzaprine (FLEXERIL) 10 MG tablet Take 10 mg by mouth 3 (three) times daily as needed. For muscle cramps      . ibuprofen (ADVIL,MOTRIN) 200 MG tablet Take 200 mg by mouth every 6 (six) hours as needed.      Marland Kitchen omeprazole (PRILOSEC) 20 MG capsule Take 20 mg by mouth daily.      Marland Kitchen oxyCODONE-acetaminophen (PERCOCET) 10-325 MG per tablet Take 1 tablet by mouth every 6 (six) hours as needed. For pain      . valACYclovir (VALTREX) 1000 MG tablet Take 0.5 tablets (500 mg  total) by mouth 3 (three) times daily.  15 tablet  0    Allergies as of 12/11/2011  . (No Known Allergies)    Family History:There is no known family history of colorectal carcinoma , liver disease, or inflammatory bowel disease.  Problem Relation Age of Onset  . Ulcers Father     History   Social History  . Marital Status: Married    Spouse Name: N/A    Number of Children: 2  . Years of Education: N/A   Occupational History  . 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 Smoking status: Current Everyday Smoker -- 0.5 packs/day for 8 years    Types: Cigarettes  . Smokeless tobacco: Not on file   Comment: stress  . Alcohol Use: No  . Drug Use: No  . Sexually Active: Not on file   Other Topics Concern  . Not on file   Social History Narrative   2  healthy daughters  Review of Systems: Gen: Denies any fever, chills, sweats, anorexia, fatigue, weakness, malaise, and sleep disorder CV: Denies angina, palpitations, syncope, orthopnea, PND, peripheral edema, and claudication. Resp: Denies dyspnea at rest, dyspnea with exercise, cough, sputum, wheezing, coughing up blood, and pleurisy. GI: Denies vomiting blood, jaundice, and fecal incontinence.  GU : Denies urinary burning, blood in urine, urinary frequency, urinary hesitancy, nocturnal urination, and urinary incontinence. MS: Complains of chronic low back pain Denies joint pain, limitation of movement, and swelling, stiffness,extremity pain. Denies muscle weakness, cramps, atrophy.  Derm:  Recent herpes zoster Psych: Denies depression, anxiety, memory loss, suicidal ideation, hallucinations, paranoia, and confusion. Heme: Denies bruising, bleeding, and enlarged lymph nodes. Neuro:  Denies any headaches, dizziness.  Intermittent Numbness and tingling down left leg Endo:  Denies any problems with DM, thyroid, adrenal function.  Physical Exam: BP 127/85  Pulse 91  Temp(Src) 98.2 F (36.8 C) (Temporal)  Ht 5' 7.5" (1.715 m)  Wt 166 lb 3.2 oz (75.388 kg)  BMI 25.65 kg/m2 General:   Alert,  Well-developed, well-nourished, pleasant and cooperative in NAD Head:  Normocephalic and atraumatic. Eyes:  Sclera clear, no icterus.   Conjunctiva pink. Ears:  Normal auditory acuity. Nose:  No deformity, discharge, or lesions. Mouth:  No deformity or lesions,oropharynx pink & moist. Neck:  Supple; no masses or thyromegaly. Lungs:  Clear throughout to auscultation.   No wheezes, crackles, or rhonchi. No acute distress. Heart:  Regular rate and rhythm; no murmurs, clicks, rubs,  or gallops. Abdomen:  Normal bowel sounds.  No bruits.  Soft, non-tender and non-distended without masses, hepatosplenomegaly or hernias noted.  No guarding or rebound tenderness.   Rectal:  Deferred until colonoscopy. Msk:   Symmetrical without gross deformities. Normal posture. Pulses:  Normal pulses noted. Extremities:  No clubbing or edema. Neurologic:  Alert and oriented x4;  grossly normal neurologically. Skin: Multiple punctate erythematous scabs to anterior chest. No exudates. No vesicles. Lymph Nodes:  No significant cervical adenopathy. Psych:  Alert and cooperative. Normal mood and affect.

## 2011-12-23 ENCOUNTER — Encounter: Payer: Self-pay | Admitting: Internal Medicine

## 2011-12-25 ENCOUNTER — Telehealth: Payer: Self-pay

## 2011-12-25 NOTE — Telephone Encounter (Signed)
CT A/P w/ IV/oral contrast re: chronic severe generalized abd pain, N/V

## 2011-12-25 NOTE — Telephone Encounter (Signed)
Pt called, SS went over letter that [facility] did and has been mailed to pt. Pt stated he was still having abd pain, its about the same as it was during his ov. Only new symptoms are coming from a "stomach bug" that his kids and wife have also had. He wants to know what the next step is. Please advise.

## 2011-12-26 ENCOUNTER — Other Ambulatory Visit: Payer: Self-pay | Admitting: Gastroenterology

## 2011-12-26 DIAGNOSIS — R109 Unspecified abdominal pain: Secondary | ICD-10-CM

## 2011-12-26 DIAGNOSIS — R112 Nausea with vomiting, unspecified: Secondary | ICD-10-CM

## 2011-12-26 NOTE — Telephone Encounter (Signed)
Pt aware, he would like to get it done asap because he wants to go back to work as soon as he can.   CD- please schedule.

## 2011-12-26 NOTE — Telephone Encounter (Signed)
CT scheduled for 03/15 @ 2:30- pt aware to pick up contrast and be NPO 6hrs prior

## 2011-12-28 ENCOUNTER — Ambulatory Visit (HOSPITAL_COMMUNITY)
Admission: RE | Admit: 2011-12-28 | Discharge: 2011-12-28 | Disposition: A | Discharge status: Home / Self Care | Payer: BC Managed Care – PPO | Source: Ambulatory Visit | Attending: Urgent Care | Admitting: Urgent Care

## 2011-12-28 DIAGNOSIS — R112 Nausea with vomiting, unspecified: Secondary | ICD-10-CM | POA: Insufficient documentation

## 2011-12-28 DIAGNOSIS — R1031 Right lower quadrant pain: Secondary | ICD-10-CM | POA: Insufficient documentation

## 2011-12-28 DIAGNOSIS — M51379 Other intervertebral disc degeneration, lumbosacral region without mention of lumbar back pain or lower extremity pain: Secondary | ICD-10-CM | POA: Insufficient documentation

## 2011-12-28 DIAGNOSIS — R109 Unspecified abdominal pain: Secondary | ICD-10-CM

## 2011-12-28 DIAGNOSIS — R1032 Left lower quadrant pain: Secondary | ICD-10-CM | POA: Insufficient documentation

## 2011-12-28 DIAGNOSIS — M5126 Other intervertebral disc displacement, lumbar region: Secondary | ICD-10-CM | POA: Insufficient documentation

## 2011-12-28 DIAGNOSIS — M5137 Other intervertebral disc degeneration, lumbosacral region: Secondary | ICD-10-CM | POA: Insufficient documentation

## 2011-12-28 MED ORDER — IOHEXOL 300 MG/ML  SOLN
100.0000 mL | Freq: Once | INTRAMUSCULAR | Status: AC | PRN
  Administered 2011-12-28: 100 mL via INTRAVENOUS

## 2011-12-31 NOTE — Progress Notes (Signed)
Quick Note:  Please call & let pt know that his abdominal organs look ok on CT-normal. He did, however, have some degenerative changes of his spine which should be followed up with his primary care provider. Office visit with [place] in 6 weeks reason followup abdominal pain He should make an appointment with Kirk Ruths, MD regarding lumbar disc CT findings CC: Kirk Ruths, MD   ______

## 2011-12-31 NOTE — Progress Notes (Signed)
Faxed results to PCP 

## 2012-01-06 ENCOUNTER — Encounter: Payer: Self-pay | Admitting: Internal Medicine

## 2013-08-05 ENCOUNTER — Other Ambulatory Visit: Payer: Self-pay | Admitting: Family Medicine

## 2013-08-05 DIAGNOSIS — IMO0002 Reserved for concepts with insufficient information to code with codable children: Secondary | ICD-10-CM

## 2013-12-07 ENCOUNTER — Other Ambulatory Visit: Payer: Self-pay | Admitting: Family Medicine

## 2013-12-07 DIAGNOSIS — IMO0002 Reserved for concepts with insufficient information to code with codable children: Secondary | ICD-10-CM

## 2013-12-07 DIAGNOSIS — M549 Dorsalgia, unspecified: Secondary | ICD-10-CM

## 2013-12-14 ENCOUNTER — Other Ambulatory Visit: Payer: Self-pay | Admitting: Family Medicine

## 2013-12-14 DIAGNOSIS — M549 Dorsalgia, unspecified: Secondary | ICD-10-CM

## 2013-12-21 ENCOUNTER — Ambulatory Visit
Admission: RE | Admit: 2013-12-21 | Discharge: 2013-12-21 | Disposition: A | Discharge status: Home / Self Care | Payer: BC Managed Care – PPO | Source: Ambulatory Visit | Attending: Family Medicine | Admitting: Family Medicine

## 2013-12-21 DIAGNOSIS — M549 Dorsalgia, unspecified: Secondary | ICD-10-CM

## 2014-08-11 ENCOUNTER — Telehealth: Payer: Self-pay | Admitting: Neurology

## 2014-08-11 ENCOUNTER — Encounter: Payer: BC Managed Care – PPO | Admitting: Neurology

## 2014-08-11 NOTE — Telephone Encounter (Signed)
This patient did not show for an EMG appointment today.

## 2014-08-13 ENCOUNTER — Encounter: Payer: Self-pay | Admitting: *Deleted

## 2014-08-19 DIAGNOSIS — Z0289 Encounter for other administrative examinations: Secondary | ICD-10-CM

## 2014-08-31 ENCOUNTER — Ambulatory Visit (INDEPENDENT_AMBULATORY_CARE_PROVIDER_SITE_OTHER): Payer: Self-pay

## 2014-08-31 ENCOUNTER — Ambulatory Visit (INDEPENDENT_AMBULATORY_CARE_PROVIDER_SITE_OTHER): Payer: BC Managed Care – PPO | Admitting: Neurology

## 2014-08-31 DIAGNOSIS — M545 Low back pain: Secondary | ICD-10-CM

## 2014-08-31 DIAGNOSIS — M47816 Spondylosis without myelopathy or radiculopathy, lumbar region: Secondary | ICD-10-CM

## 2014-08-31 DIAGNOSIS — G8929 Other chronic pain: Secondary | ICD-10-CM

## 2014-08-31 DIAGNOSIS — Z0289 Encounter for other administrative examinations: Secondary | ICD-10-CM

## 2014-08-31 DIAGNOSIS — R252 Cramp and spasm: Secondary | ICD-10-CM

## 2014-08-31 DIAGNOSIS — M5136 Other intervertebral disc degeneration, lumbar region: Secondary | ICD-10-CM

## 2014-08-31 NOTE — Procedures (Signed)
     HISTORY:  Keith Willis is a 38 year old gentleman with a long-standing history of low back pain. Within the last 9 months, he has developed cramping that involves the left leg in the evenings and at night. The patient has cramps in the hamstring muscles and in the calf muscles and foot. The right leg is not involved. The patient is being evaluated for this issue.  NERVE CONDUCTION STUDIES:  Nerve conduction studies were performed on both lower extremities. The distal motor latencies and motor amplitudes for the peroneal and posterior tibial nerves were within normal limits. The nerve conduction velocities for these nerves were also normal. The H reflex latencies were symmetric, but were slightly prolonged bilaterally. The sensory latencies for the peroneal nerves were within normal limits.   EMG STUDIES:  EMG study was performed on the left lower extremity:  The tibialis anterior muscle reveals 2 to 4K motor units with full recruitment. No fibrillations or positive waves were seen. The peroneus tertius muscle reveals 2 to 4K motor units with full recruitment. No fibrillations or positive waves were seen. The medial gastrocnemius muscle reveals 1 to 3K motor units with full recruitment. No fibrillations or positive waves were seen. The vastus lateralis muscle reveals 2 to 4K motor units with full recruitment. No fibrillations or positive waves were seen. The iliopsoas muscle reveals 2 to 4K motor units with full recruitment. No fibrillations or positive waves were seen. The biceps femoris muscle (long head) reveals 2 to 4K motor units with full recruitment. No fibrillations or positive waves were seen. The lumbosacral paraspinal muscles were tested at 3 levels, and revealed no abnormalities of insertional activity at all 3 levels tested. There was good relaxation.   IMPRESSION:  Nerve conduction studies done on both lower extremities were essentially within normal limits. There is no  evidence of a peripheral neuropathy. EMG evaluation of the left lower extremity was unremarkable, without evidence of an overlying lumbosacral radiculopathy or a primary myopathic disorder.  Jill Alexanders MD 08/31/2014 1:54 PM  [facility] [address] [place], [place] [address]  Phone [phone] Fax [phone]

## 2014-12-28 ENCOUNTER — Other Ambulatory Visit: Payer: Self-pay | Admitting: Neurosurgery

## 2014-12-28 DIAGNOSIS — M5416 Radiculopathy, lumbar region: Secondary | ICD-10-CM

## 2015-01-14 ENCOUNTER — Other Ambulatory Visit: Payer: Self-pay

## 2015-01-21 ENCOUNTER — Ambulatory Visit
Admission: RE | Admit: 2015-01-21 | Discharge: 2015-01-21 | Disposition: A | Discharge status: Home / Self Care | Payer: BLUE CROSS/BLUE SHIELD | Source: Ambulatory Visit | Attending: Neurosurgery | Admitting: Neurosurgery

## 2015-01-21 DIAGNOSIS — M5416 Radiculopathy, lumbar region: Secondary | ICD-10-CM

## 2015-01-21 MED ORDER — IOHEXOL 180 MG/ML  SOLN
15.0000 mL | Freq: Once | INTRAMUSCULAR | Status: AC | PRN
  Administered 2015-01-21: 12 mL via INTRATHECAL

## 2015-01-21 MED ORDER — DIAZEPAM 5 MG PO TABS
5.0000 mg | ORAL_TABLET | Freq: Once | ORAL | Status: AC
  Administered 2015-01-21: 5 mg via ORAL

## 2015-01-21 MED ORDER — MEPERIDINE HCL 100 MG/ML IJ SOLN
75.0000 mg | Freq: Once | INTRAMUSCULAR | Status: AC
  Administered 2015-01-21: 75 mg via INTRAMUSCULAR

## 2015-01-21 MED ORDER — ONDANSETRON HCL 4 MG/2ML IJ SOLN
4.0000 mg | Freq: Once | INTRAMUSCULAR | Status: AC
  Administered 2015-01-21: 4 mg via INTRAMUSCULAR

## 2015-01-21 NOTE — Discharge Instructions (Signed)

## 2015-10-31 IMAGING — CT CT L SPINE W/ CM
3 of 11 series · 8 of 33 positions shown, 10 images · non-contrast
Comparison: 12/21/2013 lumbar spine MR and 12/28/2011 CT.

CLINICAL DATA: 39-year-old male with back and left leg pain.
Initial encounter.
TECHNIQUE: Contiguous axial images were obtained through the Lumbar spine after
the intrathecal infusion of infusion. Coronal and sagittal
reconstructions were obtained of the axial image sets.

[Series 5: l spine detail · axial · 0.27mm/px · z∈[-271,-191]mm · 2 of 96 slices shown, 3 images]
[im 32/96  soft-tissue]
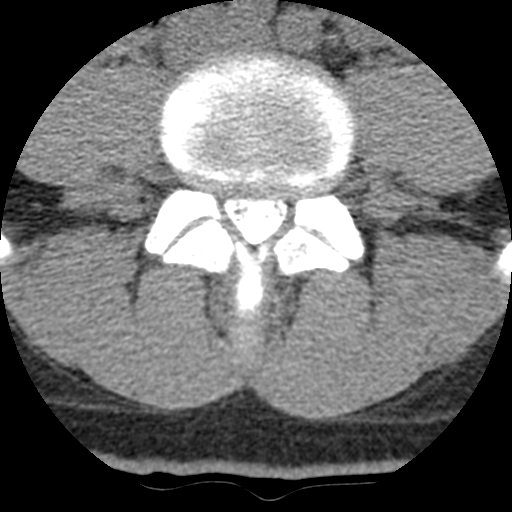
[im 32/96  bone]
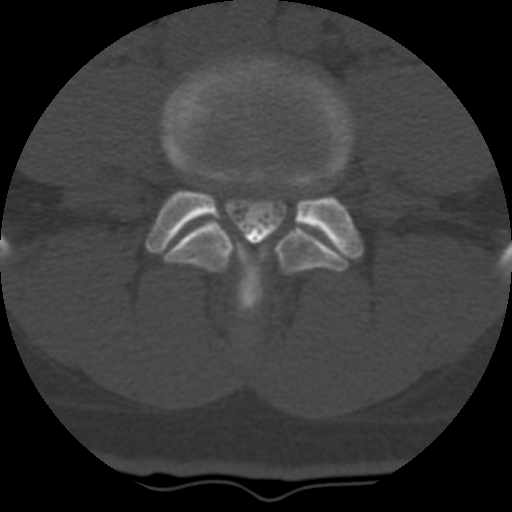
[im 64/96  bone]
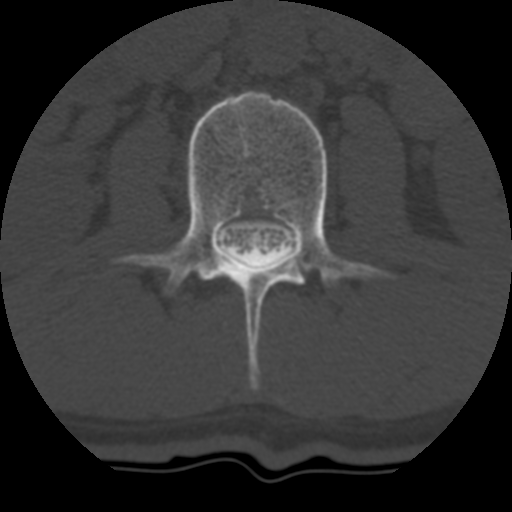

[Series 201: cor lower · coronal · 0.48mm/px · 1 of 53 slices shown]
[im 27/53  bone]
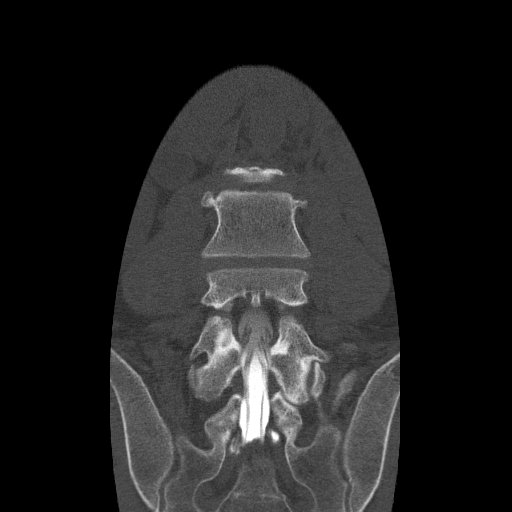

[Series 202: sagittal · sagittal · 0.48mm/px · 5 of 53 slices shown, 6 images]
[im 18/53  bone]
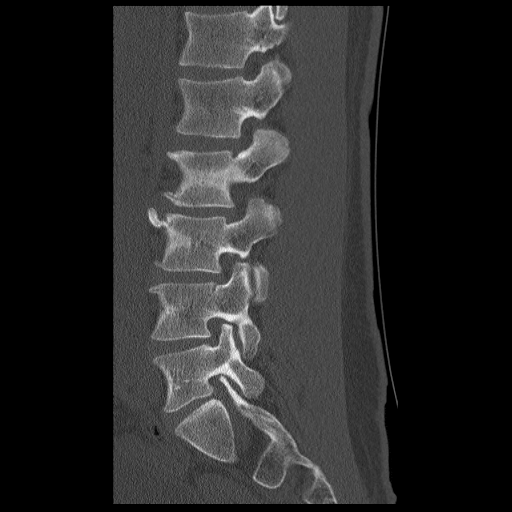
[im 22/53  bone]
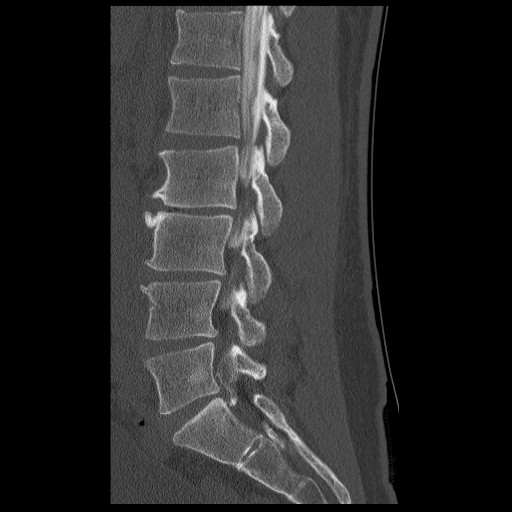
[im 27/53  soft-tissue]
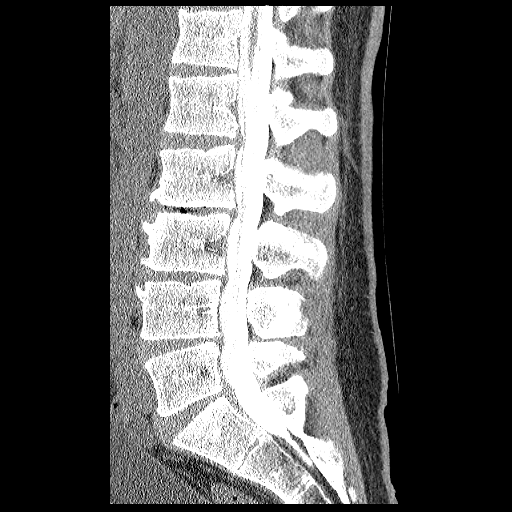
[im 27/53  bone]
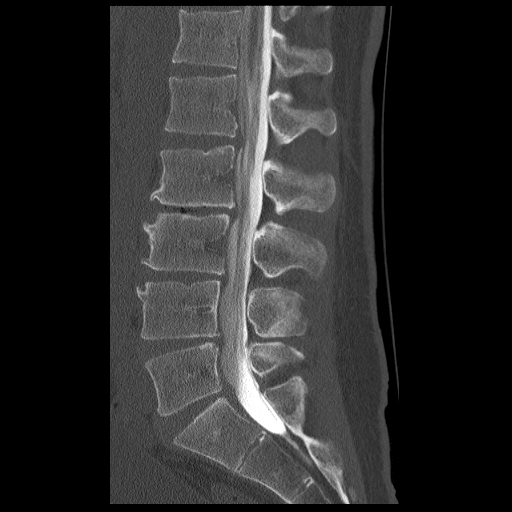
[im 31/53  bone]
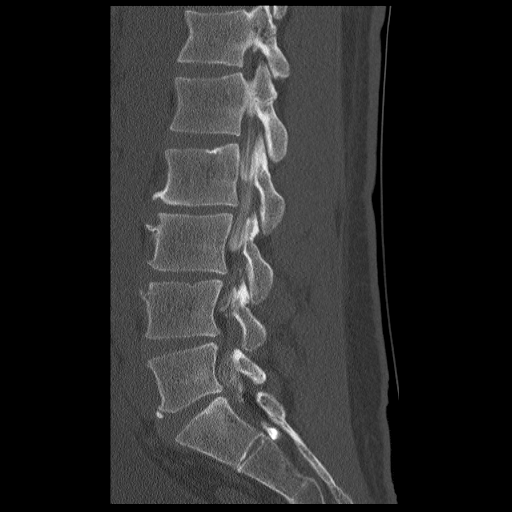
[im 35/53  bone]
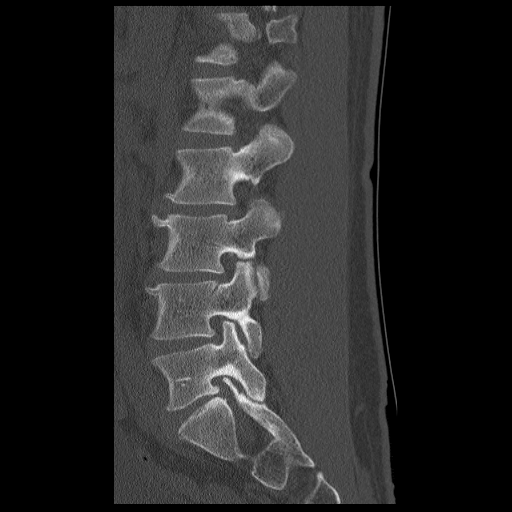

[8 of 33 positions shown; findings below may reference images not displayed]

EXAM:
LUMBAR MYELOGRAM

FLUOROSCOPY TIME:  1 minutes and 24 seconds.  EEdZy cm2

PROCEDURE:
After thorough discussion of risks and benefits of the procedure
including bleeding, infection, injury to nerves, blood vessels,
adjacent structures as well as headache and CSF leak, written and
oral informed consent was obtained. Consent was obtained by Dr.
Vlad Galbraith. Time out form was completed.

Patient was positioned prone on the fluoroscopy table. Local
anesthesia was provided with 1% lidocaine without epinephrine after
prepped and draped in the usual sterile fashion. Puncture was
performed at L3-4 using a 3 1/2 inch 22-gauge spinal needle via left
paracentral approach. Using a single pass through the dura, the
needle was placed within the thecal sac, with return of clear CSF.
12 cc of Zmnipaque-C4G was injected into the thecal sac, with normal
opacification of the nerve roots and cauda equina consistent with
free flow within the subarachnoid space.

I personally performed the lumbar puncture and administered the
intrathecal contrast. I also personally supervised acquisition of
the myelogram images.
FINDINGS: LUMBAR MYELOGRAM FINDINGS:

Ventral impression upon the thecal sac L1-2 through L5-S1.

At most there is minimal underfilling of the left L5 nerve root.

Minimal retrolisthesis L2 and L3 without abnormal motion between
flexion and extension.

CT LUMBAR MYELOGRAM FINDINGS:

Partial calcification iliac arteries and aorta suggestive of age
advanced atherosclerotic type changes.

Last fully open disk space is labeled L5-S1. Present examination
incorporates from T12 through upper S3.

Conus lower L1 level.

Tiny sclerotic foci of the sacrum unchanged from the prior exam and
probably represent small bone islands.

Congenitally narrowed spinal canal. Superimposed degenerative
changes including:

T12-L1:  Negative.

L1-2: Bulge with mild spinal stenosis. Minimal Schmorl's node
deformity.

L2-3: Disc degeneration with anterior osteophyte. Minimal
retrolisthesis L2. Bulge. Mild spinal stenosis.

L3-4: Disc degeneration with anterior osteophyte. Minimal Schmorl's
node deformity. Minimal retrolisthesis L3. Bulge. Mild spinal
stenosis.

L4-5: Mild disc space narrowing. Bulge. Mild spinal stenosis. Very
mild bilateral foraminal narrowing.

L5-S1: Moderate bulge with foraminal/ lateral extension greater on
the left (with associated spur greater left foraminal/ lateral
position). Bilateral foraminal narrowing with encroachment upon the
exiting L5 nerve roots greater left foraminal/ lateral position.
Bilateral lateral recess stenosis. Mild ventral impression upon the
thecal sac.
IMPRESSION: LUMBAR MYELOGRAM IMPRESSION:

Ventral impression upon the thecal sac L1-2 through L5-S1.

At most there is minimal underfilling of the left L5 nerve root.

Minimal retrolisthesis L2 and L3 without abnormal motion between
flexion and extension.

CT LUMBAR MYELOGRAM IMPRESSION:

Baseline mild congenital narrowing of the lumbar spine.
Additionally:

L5-S1 moderate bulge with foraminal/ lateral extension greater on
the left (with associated spur greater left foraminal/ lateral
position). Bilateral foraminal narrowing with encroachment upon the
exiting L5 nerve roots greater left foraminal/ lateral position.
Bilateral lateral recess stenosis crowding the upper S1 nerve roots.
Mild ventral impression upon the thecal sac.

L4-5 bulge. Mild spinal stenosis. Very mild bilateral foraminal
narrowing.

L3-4 minimal retrolisthesis L3.  Bulge.  Mild spinal stenosis.

L2-3 minimal retrolisthesis L2.  Bulge.  Mild spinal stenosis.

L1-2 bulge with mild spinal stenosis.

Slightly age advanced atherosclerotic type changes.

## 2016-11-22 ENCOUNTER — Encounter: Payer: Self-pay | Admitting: Internal Medicine

## 2019-08-14 ENCOUNTER — Other Ambulatory Visit: Payer: Self-pay

## 2019-08-14 ENCOUNTER — Encounter (HOSPITAL_COMMUNITY): Payer: Self-pay

## 2019-08-14 ENCOUNTER — Emergency Department (HOSPITAL_COMMUNITY)
Admission: EM | Admit: 2019-08-14 | Discharge: 2019-08-14 | Disposition: A | Discharge status: Home / Self Care | Payer: BLUE CROSS/BLUE SHIELD | Attending: Emergency Medicine | Admitting: Emergency Medicine

## 2019-08-14 DIAGNOSIS — T405X1A Poisoning by cocaine, accidental (unintentional), initial encounter: Secondary | ICD-10-CM | POA: Insufficient documentation

## 2019-08-14 DIAGNOSIS — F17210 Nicotine dependence, cigarettes, uncomplicated: Secondary | ICD-10-CM | POA: Insufficient documentation

## 2019-08-14 DIAGNOSIS — T50901A Poisoning by unspecified drugs, medicaments and biological substances, accidental (unintentional), initial encounter: Secondary | ICD-10-CM

## 2019-08-14 MED ORDER — SODIUM CHLORIDE 0.9 % IV BOLUS
1000.0000 mL | Freq: Once | INTRAVENOUS | Status: AC
  Administered 2019-08-14: 21:00:00 1000 mL via INTRAVENOUS

## 2019-08-14 NOTE — ED Notes (Addendum)
Patient asking for ice chips or ice water. Reminded patient that he can not have anything to drink or eat until after he is seen by EDP.

## 2019-08-14 NOTE — ED Notes (Addendum)
EKG done and given to Dr Vanita Panda. Patient on 12 lead at this time also.

## 2019-08-14 NOTE — ED Notes (Signed)
In to ask pt to provide a urine specimen but pt stated that he "already went". Told pt to please get [place] a specimen next time he has to go.

## 2019-08-14 NOTE — Discharge Instructions (Addendum)
As discussed, your evaluation today has been largely reassuring.  But, it is important that you monitor your condition carefully, and do not hesitate to return to the ED if you develop new, or concerning changes in your condition. ? ?Otherwise, please follow-up with your physician for appropriate ongoing care. ? ?

## 2019-08-14 NOTE — ED Provider Notes (Signed)
[facility] EMERGENCY DEPARTMENT Provider Note   CSN: [id number] Arrival date & time: 08/14/19  1957     History   Chief Complaint Chief Complaint  Patient presents with  . Drug Overdose    HPI Keith Willis is a 43 y.o. male.     HPI Patient presents via EMS after a possible overdose. Here the patient is awake, alert, states that he used cocaine with an old high school companion. He is unsure of what else may have been in the substance. He denies using other illicit substances, drinks alcohol only moderately. Reportedly the patient was found unresponsive, but improved substantially after receiving Narcan.  None currently denies any pain, notes that he is somewhat apprehensive about what may have been in the cocaine, but denies other complaints.  History obtained via EMS providers and the patient himself.  Past Medical History:  Diagnosis Date  . Anxiety   . H. pylori infection 11/2011   treatment  . Low back pain   . Shortness of breath     Patient Active Problem List   Diagnosis Date Noted  . H. pylori infection 12/11/2011  . Abdominal pain 12/11/2011  . Hematochezia 12/11/2011    Past Surgical History:  Procedure Laterality Date  . FRACTURE SURGERY     face  . KNEE ARTHROSCOPY          Home Medications    Prior to Admission medications   Medication Sig Start Date End Date Taking? Authorizing Provider  omeprazole (PRILOSEC) 20 MG capsule Take 40 mg by mouth daily.    Yes [provider]    Family History Family History  Problem Relation Age of Onset  . Ulcers Father   . Colon cancer Neg Hx     Social History Social History   Tobacco Use  . Smoking status: Current Every Day Smoker    Packs/day: 0.50    Years: 8.00    Pack years: 4.00    Types: Cigarettes  . Smokeless tobacco: Never Used  . Tobacco comment: stress  Substance Use Topics  . Alcohol use: No  . Drug use: Yes    Types: Cocaine     Allergies   Patient has no  known allergies.   Review of Systems Review of Systems  Constitutional:       Per HPI, otherwise negative  HENT:       Per HPI, otherwise negative  Respiratory:       Per HPI, otherwise negative  Cardiovascular:       Per HPI, otherwise negative  Gastrointestinal: Negative for vomiting.  Endocrine:       Negative aside from HPI  Genitourinary:       Neg aside from HPI   Musculoskeletal:       Per HPI, otherwise negative  Skin: Negative.   Neurological: Negative for syncope.     Physical Exam Updated Vital Signs BP (!) 157/117   Pulse (!) 104   Temp 97.8 F (36.6 C) (Oral)   Resp 18   Ht 5\' 8"  (1.727 m)   Wt 81.6 kg   SpO2 92%   BMI 27.37 kg/m   Physical Exam Vitals signs and nursing note reviewed.  Constitutional:      General: He is not in acute distress.    Appearance: He is well-developed.  HENT:     Head: Normocephalic and atraumatic.  Eyes:     Conjunctiva/sclera: Conjunctivae normal.  Cardiovascular:     Rate and Rhythm:  Normal rate and regular rhythm.  Pulmonary:     Effort: Pulmonary effort is normal. No respiratory distress.     Breath sounds: No stridor.  Abdominal:     General: There is no distension.  Skin:    General: Skin is warm and dry.  Neurological:     Mental Status: He is alert and oriented to person, place, and time.      ED Treatments / Results  Labs (all labs ordered are listed, but only abnormal results are displayed) Labs Reviewed  URINALYSIS, ROUTINE W REFLEX MICROSCOPIC  RAPID URINE DRUG SCREEN, HOSP PERFORMED    EKG EKG Interpretation  Date/Time:  Friday August 14 2019 20:04:19 EDT Ventricular Rate:  107 PR Interval:    QRS Duration: 117 QT Interval:  399 QTC Calculation: 533 R Axis:   51 Text Interpretation: Sinus tachycardia Incomplete right bundle branch block Artifact Abnormal ECG Confirmed by Carmin Muskrat [phone]) on 08/14/2019 8:25:46 PM   Radiology No results found.  Procedures Procedures  (including critical care time)  Medications Ordered in ED Medications  sodium chloride 0.9 % bolus 1,000 mL (0 mLs Intravenous Stopped 08/14/19 2150)     Initial Impression / Assessment and Plan / ED Course  I have reviewed the triage vital signs and the nursing notes.  Pertinent labs & imaging results that were available during my care of the patient were reviewed by me and considered in my medical decision making (see chart for details).        10:15 PM Heart rate now below 100, patient awake, alert, no ongoing complaints per He has been monitored for several hours.  On the way presented after an overdose, with recovery following Narcan, there is no evidence for recurrence, with no ongoing complaints, no hemodynamic instability, patient agreeable to discharge.   Final Clinical Impressions(s) / ED Diagnoses   Final diagnoses:  Accidental drug overdose, initial encounter    ED Discharge Orders    None       Carmin Muskrat, MD 08/14/19 2216

## 2019-08-14 NOTE — ED Triage Notes (Signed)
Pt brought in by [facility] after being found unresponsive and apneic on the floor.  Pt was given narcan both nasally and then IV with wakefullness.  Pt states he did have some cocaine, but feels it wasn't enough to cause a problem.   Pt denies other complaints.

## 2021-02-12 DEATH — deceased
